# Patient Record
Sex: Female | Born: 1995 | Race: White | Hispanic: Yes | Marital: Single | Smoking: Never smoker
Health system: Southern US, Community
[De-identification: ages and names within clinical notes are randomized; demographics above are authoritative.]

## PROBLEM LIST (undated history)

## (undated) DIAGNOSIS — Z789 Other specified health status: Secondary | ICD-10-CM

## (undated) HISTORY — PX: NO PAST SURGERIES: SHX2092

---

## 2020-05-15 ENCOUNTER — Encounter (HOSPITAL_COMMUNITY): Payer: Self-pay | Admitting: Obstetrics and Gynecology

## 2020-05-15 ENCOUNTER — Inpatient Hospital Stay (HOSPITAL_COMMUNITY): Payer: Medicaid Other

## 2020-05-15 ENCOUNTER — Inpatient Hospital Stay (HOSPITAL_COMMUNITY)
Admission: AD | Admit: 2020-05-15 | Discharge: 2020-05-15 | Disposition: A | Discharge status: Home / Self Care | Payer: Medicaid Other | Attending: Obstetrics and Gynecology | Admitting: Obstetrics and Gynecology

## 2020-05-15 ENCOUNTER — Other Ambulatory Visit: Payer: Self-pay

## 2020-05-15 DIAGNOSIS — Z3A01 Less than 8 weeks gestation of pregnancy: Secondary | ICD-10-CM | POA: Diagnosis not present

## 2020-05-15 DIAGNOSIS — O26891 Other specified pregnancy related conditions, first trimester: Secondary | ICD-10-CM | POA: Diagnosis not present

## 2020-05-15 DIAGNOSIS — R1030 Lower abdominal pain, unspecified: Secondary | ICD-10-CM | POA: Diagnosis not present

## 2020-05-15 DIAGNOSIS — O3680X0 Pregnancy with inconclusive fetal viability, not applicable or unspecified: Secondary | ICD-10-CM

## 2020-05-15 DIAGNOSIS — Z6790 Unspecified blood type, Rh positive: Secondary | ICD-10-CM | POA: Diagnosis not present

## 2020-05-15 DIAGNOSIS — N898 Other specified noninflammatory disorders of vagina: Secondary | ICD-10-CM | POA: Diagnosis present

## 2020-05-15 DIAGNOSIS — O200 Threatened abortion: Secondary | ICD-10-CM

## 2020-05-15 DIAGNOSIS — O209 Hemorrhage in early pregnancy, unspecified: Secondary | ICD-10-CM | POA: Insufficient documentation

## 2020-05-15 DIAGNOSIS — O4690 Antepartum hemorrhage, unspecified, unspecified trimester: Secondary | ICD-10-CM

## 2020-05-15 HISTORY — DX: Other specified health status: Z78.9

## 2020-05-15 LAB — COMPREHENSIVE METABOLIC PANEL
ALT: 19 U/L (ref 0–44)
AST: 20 U/L (ref 15–41)
Albumin: 4.1 g/dL (ref 3.5–5.0)
Alkaline Phosphatase: 74 U/L (ref 38–126)
Anion gap: 11 (ref 5–15)
BUN: 6 mg/dL (ref 6–20)
CO2: 23 mmol/L (ref 22–32)
Calcium: 9.1 mg/dL (ref 8.9–10.3)
Chloride: 103 mmol/L (ref 98–111)
Creatinine, Ser: 0.45 mg/dL (ref 0.44–1.00)
GFR calc Af Amer: >60 mL/min (ref >60)
GFR calc non Af Amer: >60 mL/min (ref >60)
Glucose, Bld: 102 mg/dL — ABNORMAL HIGH (ref 70–99)
Potassium: 3.8 mmol/L (ref 3.5–5.1)
Sodium: 137 mmol/L (ref 135–145)
Total Bilirubin: 0.5 mg/dL (ref 0.3–1.2)
Total Protein: 7.4 g/dL (ref 6.5–8.1)

## 2020-05-15 LAB — CBC WITH DIFFERENTIAL/PLATELET
Abs Immature Granulocytes: 0.03 10*3/uL (ref 0.00–0.07)
Basophils Absolute: 0 10*3/uL (ref 0.0–0.1)
Basophils Relative: 0 %
Eosinophils Absolute: 0.3 10*3/uL (ref 0.0–0.5)
Eosinophils Relative: 3 %
HCT: 44.3 % (ref 36.0–46.0)
Hemoglobin: 15 g/dL (ref 12.0–15.0)
Immature Granulocytes: 0 %
Lymphocytes Relative: 22 %
Lymphs Abs: 1.8 10*3/uL (ref 0.7–4.0)
MCH: 30.1 pg (ref 26.0–34.0)
MCHC: 33.9 g/dL (ref 30.0–36.0)
MCV: 89 fL (ref 80.0–100.0)
Monocytes Absolute: 0.5 10*3/uL (ref 0.1–1.0)
Monocytes Relative: 6 %
Neutro Abs: 5.4 10*3/uL (ref 1.7–7.7)
Neutrophils Relative %: 69 %
Platelets: 259 10*3/uL (ref 150–400)
RBC: 4.98 MIL/uL (ref 3.87–5.11)
RDW: 12.4 % (ref 11.5–15.5)
WBC: 8 10*3/uL (ref 4.0–10.5)
nRBC: 0 % (ref 0.0–0.2)

## 2020-05-15 LAB — WET PREP, GENITAL
Clue Cells Wet Prep HPF POC: NONE SEEN
Sperm: NONE SEEN
Trich, Wet Prep: NONE SEEN
Yeast Wet Prep HPF POC: NONE SEEN

## 2020-05-15 LAB — I-STAT BETA HCG BLOOD, ED (MC, WL, AP ONLY): I-stat hCG, quantitative: 19.3 m[IU]/mL — ABNORMAL HIGH (ref <5)

## 2020-05-15 LAB — HCG, QUANTITATIVE, PREGNANCY: hCG, Beta Chain, Quant, S: 22 m[IU]/mL — ABNORMAL HIGH (ref <5)

## 2020-05-15 NOTE — Discharge Instructions (Signed)
Threatened Miscarriage  A threatened miscarriage occurs when a woman has vaginal bleeding during the first 20 weeks of pregnancy but the pregnancy has not ended. If you have vaginal bleeding during this time, your health care provider will do tests to make sure you are still pregnant. If the tests show that you are still pregnant and that the developing baby (fetus) inside your uterus is still growing, your condition is considered a threatened miscarriage. A threatened miscarriage does not mean your pregnancy will end, but it does increase the risk of losing your pregnancy (complete miscarriage). What are the causes? The cause of this condition is usually not known. For women who go on to have a complete miscarriage, the most common cause is an abnormal number of chromosomes in the developing baby. Chromosomes are the structures inside cells that hold all of a person's genetic material. What increases the risk? The following lifestyle factors may increase your risk of a miscarriage in early pregnancy:  Smoking.  Drinking excessive amounts of alcohol or caffeine.  Recreational drug use. The following preexisting health conditions may increase your risk of a miscarriage in early pregnancy:  Polycystic ovary syndrome.  Uterine fibroids.  Infections.  Diabetes mellitus. What are the signs or symptoms? Symptoms of this condition include:  Vaginal bleeding.  Mild abdominal pain or cramps. How is this diagnosed? If you have bleeding with or without abdominal pain before 20 weeks of pregnancy, your health care provider will do tests to check whether you are still pregnant. These will include:  Ultrasound. This test uses sound waves to create images of the inside of your uterus. This allows your health care provider to look at your developing baby and other structures, such as your placenta.  Pelvic exam. This is an internal exam of your vagina and cervix.  Measurement of your baby's heart  rate.  Laboratory tests such as blood tests, urine tests, or swabs for infection You may be diagnosed with a threatened miscarriage if:  Ultrasound testing shows that you are still pregnant.  Your baby's heart rate is strong.  A pelvic exam shows that the opening between your uterus and your vagina (cervix) is closed.  Blood tests confirm that you are still pregnant. How is this treated? No treatments have been shown to prevent a threatened miscarriage from going on to a complete miscarriage. However, the right home care is important. Follow these instructions at home:  Get plenty of rest.  Do not have sex or use tampons if you have vaginal bleeding.  Do not douche.  Do not smoke or use recreational drugs.  Do not drink alcohol.  Avoid caffeine.  Keep all follow-up prenatal visits as told by your health care provider. This is important. Contact a health care provider if:  You have light vaginal bleeding or spotting while pregnant.  You have abdominal pain or cramping.  You have a fever. Get help right away if:  You have heavy vaginal bleeding.  You have blood clots coming from your vagina.  You pass tissue from your vagina.  You leak fluid, or you have a gush of fluid from your vagina.  You have severe low back pain or abdominal cramps.  You have fever, chills, and severe abdominal pain. Summary  A threatened miscarriage occurs when a woman has vaginal bleeding during the first 20 weeks of pregnancy but the pregnancy has not ended.  The cause of a threatened miscarriage is usually not known.  Symptoms of this condition may   include vaginal bleeding and mild abdominal pain or cramps.  No treatments have been shown to prevent a threatened miscarriage from going on to a complete miscarriage.  Keep all follow-up prenatal visits as told by your health care provider. This is important. This information is not intended to replace advice given to you by your health  care provider. Make sure you discuss any questions you have with your health care provider. Document Revised: 12/19/2017 Document Reviewed: 02/08/2017 Elsevier Patient Education  2020 [employer]  

## 2020-05-15 NOTE — ED Notes (Signed)
This RN called MAU charge and gave report

## 2020-05-15 NOTE — ED Notes (Signed)
Transport called to bring pt over

## 2020-05-15 NOTE — ED Provider Notes (Signed)
  24 year old female G2P1001, LMP 04/12/2020, who presents to the emergency department with a chief complaint of vaginal bleeding.  The patient reports that she noticed some pink-tinged vaginal spotting earlier tonight.  Later, she noticed frank vaginal bleeding.  She also reports that she has been having some lower abdominal 5/10 pain and cramping that has been intermittent since onset 4 days ago.  No fever, chills, shortness of breath, dysuria, rash, diarrhea, or vomiting.  No treatment prior to arrival.  She believes that she is approximately 3 to [redacted] weeks pregnant as her last menstrual cycle was 04/12/2020.   She is mildly tachycardic.  Blood pressures are soft at 98/79, but no previous available for comparison of her baseline.  She is afebrile.  No respiratory distress and she has no increased work of breathing.  On exam, she has some tenderness to palpation in the bilateral lower abdomen, left greater than right.  No rebound or guarding.  Abdomen is soft and nondistended.  Pregnancy test is positive. Spoke with Venia Carbon, MAU APP, who will accept the patient for transfer to the MAU for further work-up and evaluation.  Patient is hemodynamically stable and in no acute distress at time of transfer.   Barkley Boards, PA-C 05/15/20 0131    Dione Booze, MD 05/15/20 [phone]

## 2020-05-15 NOTE — MAU Note (Addendum)
Pt reports to MAU c/o lower left sided abdominal pain. Pt states she was having pink spotting and then it was increasing today. Pt reports having period like cramps and back pain. Pt reports nausea and vomiting. Pt reports feeling stressed.

## 2020-05-15 NOTE — MAU Provider Note (Signed)
History     CSN: [id number]  Arrival date and time: 05/15/20 0025   First Provider Initiated Contact with Patient 05/15/20 0249      Chief Complaint  Patient presents with  . Vaginal Bleeding   HPI   [name] is a 24 y.o. female G2P1001 @ [redacted]w[redacted]d here with abdominal pain and bleeding. Symptoms started today.  Had an appointment with her provider last week at [facility] and had + pregnancy test. The pain is located all across her lower abdomen. She had not tried anything for the pain She rates her pain 7/10 at this time. The bleeding is bright red and menstrual like.    OB History    Gravida  2   Para  1   Term  1   Preterm      AB      Living  1     SAB      TAB      Ectopic      Multiple      Live Births  1           Past Medical History:  Diagnosis Date  . Medical history non-contributory     Past Surgical History:  Procedure Laterality Date  . NO PAST SURGERIES      History reviewed. No pertinent family history.  Social History   Tobacco Use  . Smoking status: Never Smoker  . Smokeless tobacco: Never Used  Substance Use Topics  . Alcohol use: Never  . Drug use: Never    Allergies: No Known Allergies  Medications Prior to Admission  Medication Sig Dispense Refill Last Dose  . prenatal vitamin w/FE, FA (PRENATAL 1 + 1) 27-1 MG TABS tablet Take 1 tablet by mouth daily at 12 noon.   05/14/2020 at Unknown time   Results for orders placed or performed during the hospital encounter of 05/15/20 (from the past 48 hour(s))  I-Stat Beta hCG blood, ED ([facility], [facility], [facility] only)     Status: Abnormal   Collection Time: 05/15/20 12:57 AM  Result Value Ref Range   I-stat hCG, quantitative 19.3 (H) <5 mIU/mL   Comment 3            Comment:   GEST. AGE      CONC.  (mIU/mL)   <=1 WEEK        5 - 50     2 WEEKS       50 - 500     3 WEEKS       100 - 10,000     4 WEEKS     1,000 - 30,000        FEMALE AND NON-PREGNANT FEMALE:     LESS THAN 5 mIU/mL    CBC with Differential/Platelet     Status: None   Collection Time: 05/15/20  2:01 AM  Result Value Ref Range   WBC 8.0 4.0 - 10.5 K/uL   RBC 4.98 3.87 - 5.11 MIL/uL   Hemoglobin 15.0 12.0 - 15.0 g/dL   HCT 81.8 36 - 46 %   MCV 89.0 80.0 - 100.0 fL   MCH 30.1 26.0 - 34.0 pg   MCHC 33.9 30.0 - 36.0 g/dL   RDW 56.3 14.9 - 70.2 %   Platelets 259 150 - 400 K/uL   nRBC 0.0 0.0 - 0.2 %   Neutrophils Relative % 69 %   Neutro Abs 5.4 1.7 - 7.7 K/uL   Lymphocytes Relative 22 %  Lymphs Abs 1.8 0.7 - 4.0 K/uL   Monocytes Relative 6 %   Monocytes Absolute 0.5 0 - 1 K/uL   Eosinophils Relative 3 %   Eosinophils Absolute 0.3 0 - 0 K/uL   Basophils Relative 0 %   Basophils Absolute 0.0 0 - 0 K/uL   Immature Granulocytes 0 %   Abs Immature Granulocytes 0.03 0.00 - 0.07 K/uL    Comment: Performed at [facility], [address] [place], [place] [address]  Comprehensive metabolic panel     Status: Abnormal   Collection Time: 05/15/20  2:01 AM  Result Value Ref Range   Sodium 137 135 - 145 mmol/L   Potassium 3.8 3.5 - 5.1 mmol/L   Chloride 103 98 - 111 mmol/L   CO2 23 22 - 32 mmol/L   Glucose, Bld 102 (H) 70 - 99 mg/dL    Comment: Glucose reference range applies only to samples taken after fasting for at least 8 hours.   BUN 6 6 - 20 mg/dL   Creatinine, Ser 1.91 0.44 - 1.00 mg/dL   Calcium 9.1 8.9 - 47.8 mg/dL   Total Protein 7.4 6.5 - 8.1 g/dL   Albumin 4.1 3.5 - 5.0 g/dL   AST 20 15 - 41 U/L   ALT 19 0 - 44 U/L   Alkaline Phosphatase 74 38 - 126 U/L   Total Bilirubin 0.5 0.3 - 1.2 mg/dL   GFR calc non Af Amer >60 >60 mL/min   GFR calc Af Amer >60 >60 mL/min   Anion gap 11 5 - 15    Comment: Performed at [facility], [address] [place], [place] [address]  ABO/Rh     Status: None (Preliminary result)   Collection Time: 05/15/20  2:01 AM  Result Value Ref Range   ABO/RH(D) PENDING    No rh immune globuloin      NOT A RH IMMUNE GLOBULIN CANDIDATE, PT RH  POSITIVE Performed at [facility], [address] [place], [place] [address]   hCG, quantitative, pregnancy     Status: Abnormal   Collection Time: 05/15/20  2:01 AM  Result Value Ref Range   hCG, Beta Chain, Quant, S 22 (H) <5 mIU/mL    Comment:          GEST. AGE      CONC.  (mIU/mL)   <=1 WEEK        5 - 50     2 WEEKS       50 - 500     3 WEEKS       100 - 10,000     4 WEEKS     1,000 - 30,000     5 WEEKS     3,500 - 115,000   6-8 WEEKS     12,000 - 270,000    12 WEEKS     15,000 - 220,000        FEMALE AND NON-PREGNANT FEMALE:     LESS THAN 5 mIU/mL Performed at [facility], [address], [place], [place] [address]   Wet prep, genital     Status: Abnormal   Collection Time: 05/15/20  2:05 AM   Specimen: PATH Cytology Cervicovaginal Ancillary Only  Result Value Ref Range   Yeast Wet Prep HPF POC NONE SEEN NONE SEEN   Trich, Wet Prep NONE SEEN NONE SEEN   Clue Cells Wet Prep HPF POC NONE SEEN NONE SEEN  WBC, Wet Prep HPF POC MANY (A) NONE SEEN   Sperm NONE SEEN     Comment: Performed at [facility], [place] [address] [place], [place] [address]   US OB LESS THAN 14 WEEKS WITH OB TRANSVAGINAL  Result Date: 05/15/2020 CLINICAL DATA:  Bleeding left lower quadrant pain EXAM: OBSTETRIC <14 WK [place] AND TRANSVAGINAL OB US TECHNIQUE: Both transabdominal and transvaginal ultrasound examinations were performed for complete evaluation of the gestation as well as the maternal uterus, adnexal regions, and pelvic cul-de-sac. Transvaginal technique was performed to assess early pregnancy. COMPARISON:  None. FINDINGS: Intrauterine gestational sac: None Yolk sac:  Not Visualized. Subchorionic hemorrhage:  None visualized. Maternal uterus/adnexae: Normal appearing ovaries. IMPRESSION: No intrauterine pregnancy seen on this examination. May be too early to visualize on ultrasound. Recommend follow-up quantitative B-HCG levels and follow-up US in 14 days to assess . This  recommendation follows SRU consensus guidelines: Diagnostic Criteria for Nonviable Pregnancy Early in the First Trimester. [place] Med 2013; 540:0867-61. Electronically Signed   By: Prudencio Pair M.D.   On: 05/15/2020 02:42   Review of Systems  Gastrointestinal: Positive for abdominal pain.  Genitourinary: Positive for vaginal bleeding.  Neurological: Negative for dizziness and headaches.   Physical Exam   Blood pressure 98/79, pulse (!) 107, temperature 98 F (36.7 C), temperature source Oral, resp. rate 18, last menstrual period 04/12/2020, SpO2 98 %.  Physical Exam  Constitutional: She is oriented to person, place, and time. She does not appear ill. No distress.  HENT:  Head: Normocephalic.  GI: Normal appearance.  Genitourinary:    Vulva normal.     Pelvic exam was performed with patient supine.     Genitourinary Comments: Cervix closed, thick, posterior Scant dark blood on exam glove GC and Wet prep collected by RN    Musculoskeletal:        General: Normal range of motion.     Cervical back: Normal range of motion.  Neurological: She is alert and oriented to person, place, and time.  Skin: Skin is warm.    MAU Course  Procedures  None  MDM  Wet prep & GC HIV, CBC, Hcg, ABO US OB transvaginal  Offered patient pain medication: declined   Assessment and Plan   A:  1. Blood type, Rh positive   2. Vaginal bleeding during pregnancy   3. Threatened miscarriage   4. Pregnancy of unknown anatomic location      P:  Discharge home with strict return precautions Patient prefers to f/u with her provider with [facility] She needs f/u Tuesday for stat quant Pelvic rest Ectopic precautions   Annely Sliva, Artist Pais, NP 05/16/2020 9:06 AM

## 2020-05-15 NOTE — ED Triage Notes (Addendum)
Pt presents to ED POV. Pt c/o vaginal bleeding began today. Spotting when using toilet paper noticed blood. LQ abd cramping. Pt 4w pregnant. LMP 04/12/2020

## 2020-05-16 LAB — GC/CHLAMYDIA PROBE AMP (~~LOC~~) NOT AT ARMC
Chlamydia: NEGATIVE
Comment: NEGATIVE
Comment: NORMAL
Neisseria Gonorrhea: NEGATIVE

## 2020-05-16 LAB — ABO/RH: ABO/RH(D): O POS

## 2020-12-28 IMAGING — US US OB < 14 WEEKS - US OB TV
1 series · 15 of 28 positions shown · non-contrast
Comparison: None.

CLINICAL DATA: Bleeding left lower quadrant pain

EXAM:
OBSTETRIC <14 WK US AND TRANSVAGINAL OB US
TECHNIQUE: Both transabdominal and transvaginal ultrasound examinations were
performed for complete evaluation of the gestation as well as the
maternal uterus, adnexal regions, and pelvic cul-de-sac.
Transvaginal technique was performed to assess early pregnancy.

[Series 1: us ob < 14 weeks - us ob tv · 40 acquisitions, 15 frames shown]
[im 1/40]
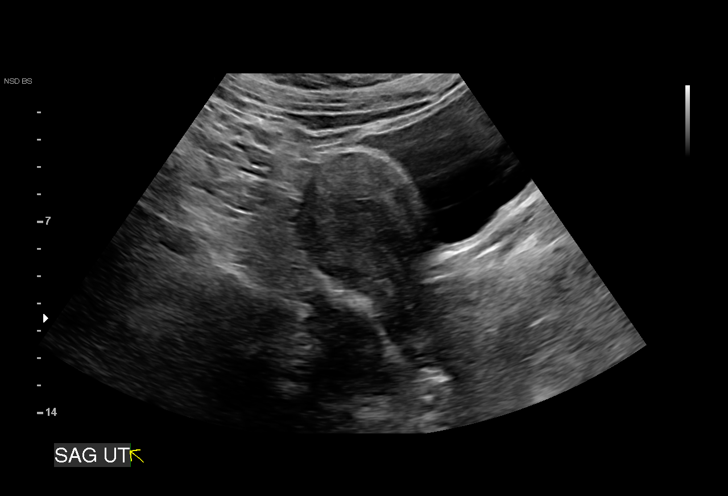
[im 3/40]
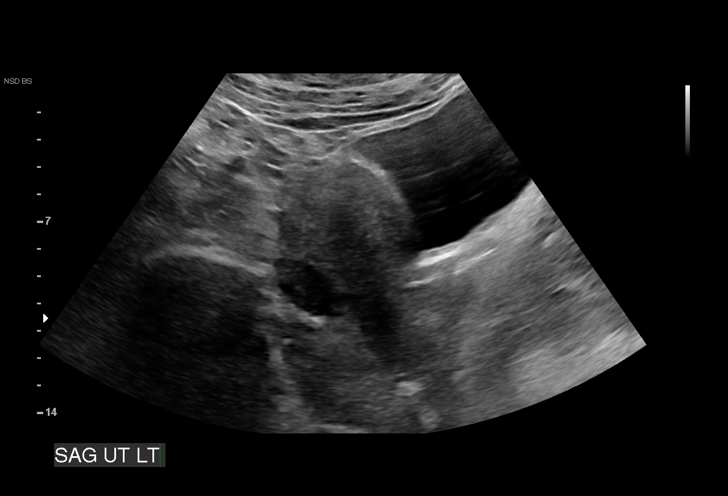
[im 6/40]
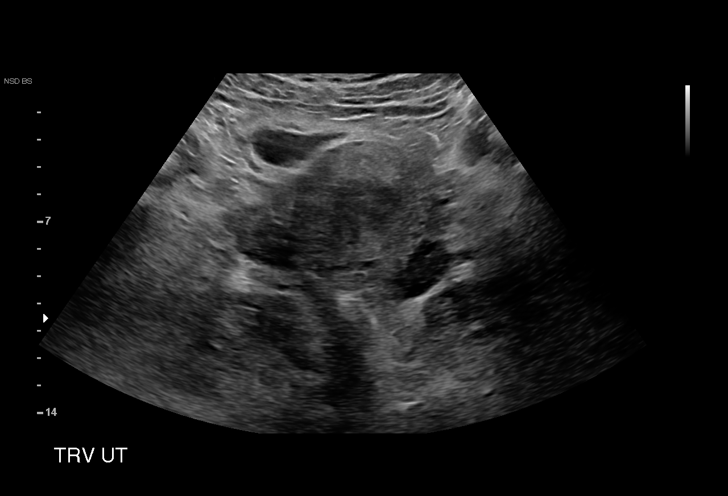
[im 9/40]
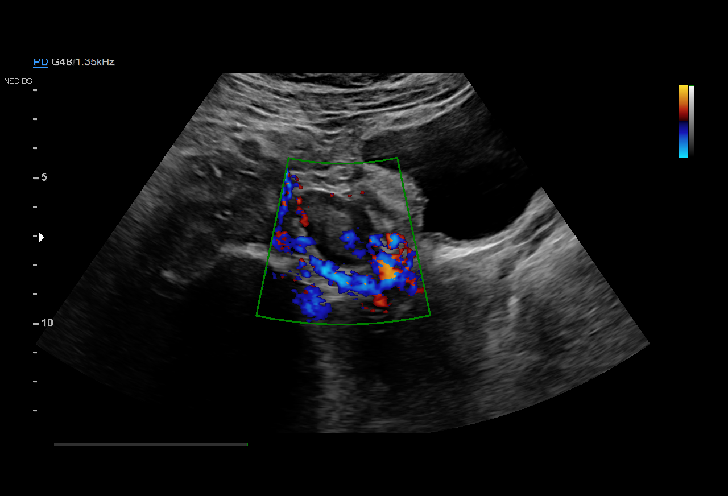
[im 12/40]
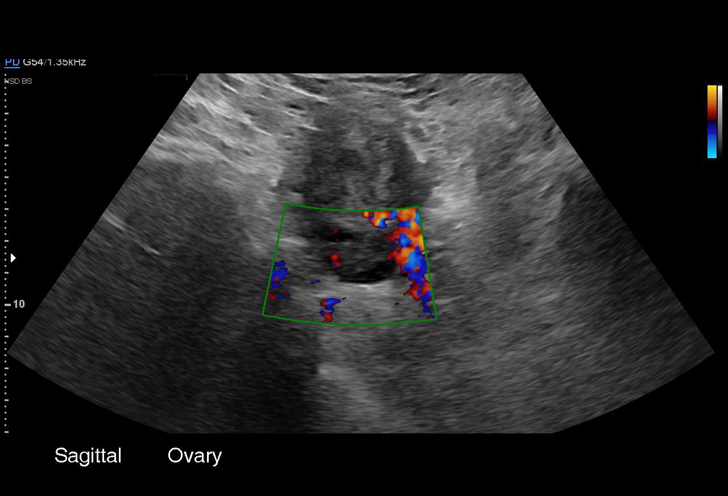
[im 15/40]
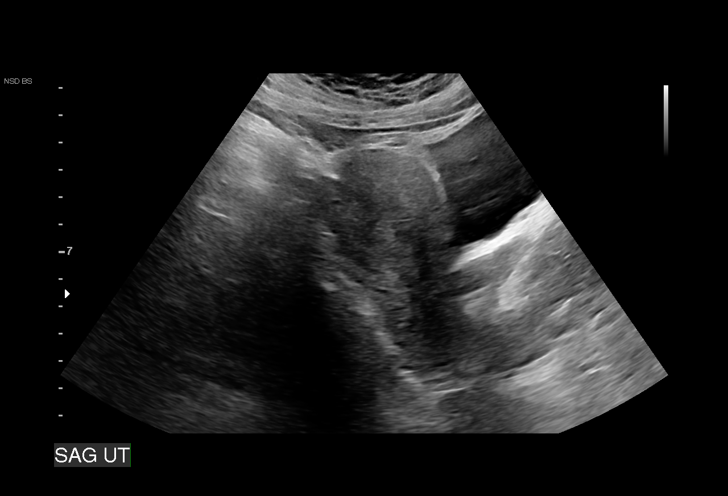
[im 18/40]
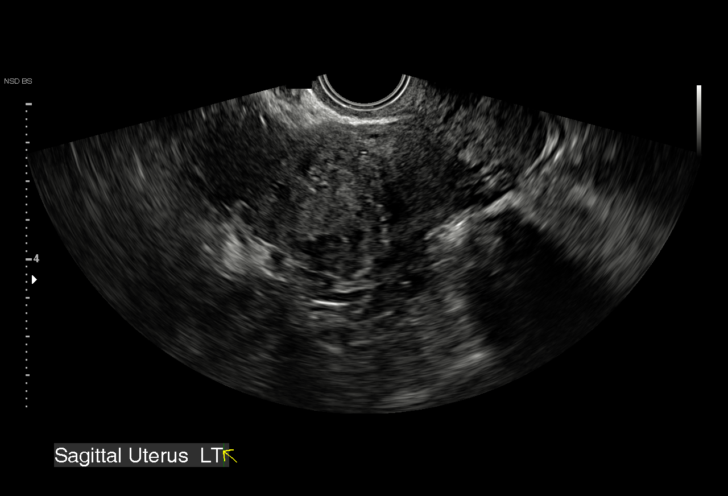
[im 21/40]
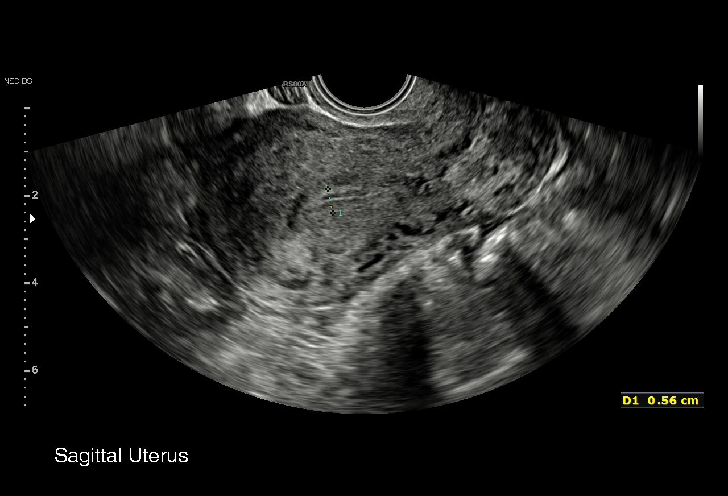
[im 22/40]
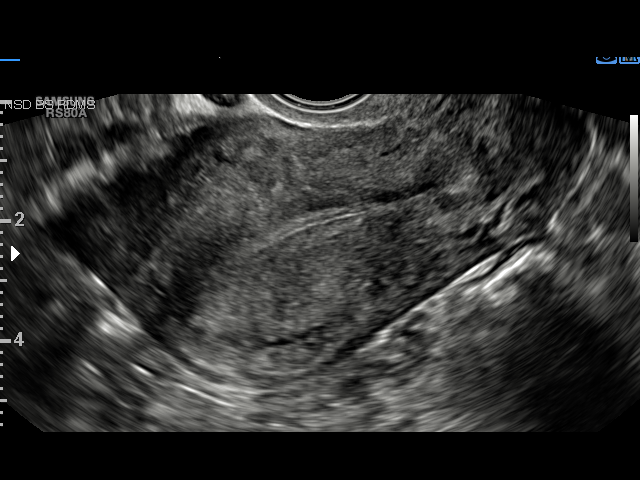
[im 25/40]
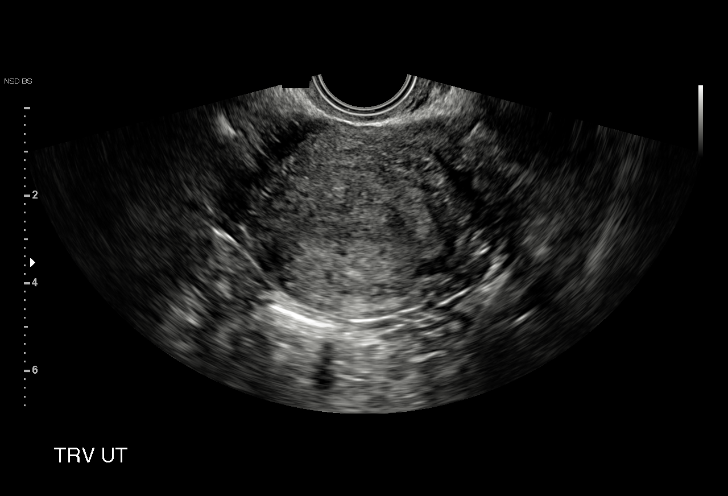
[im 28/40]
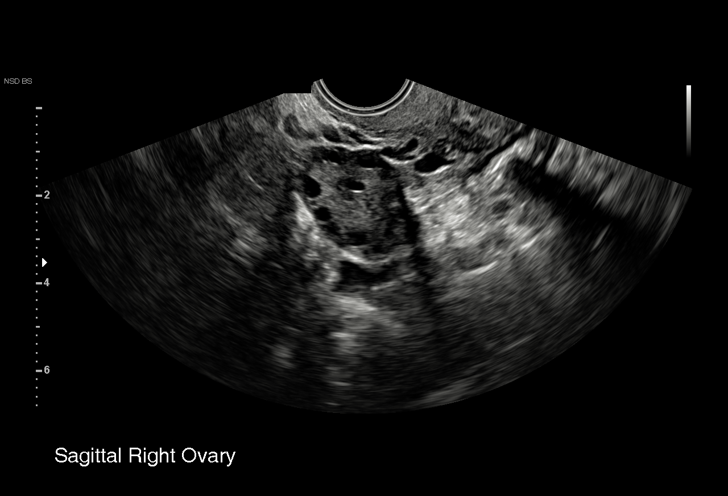
[im 31/40]
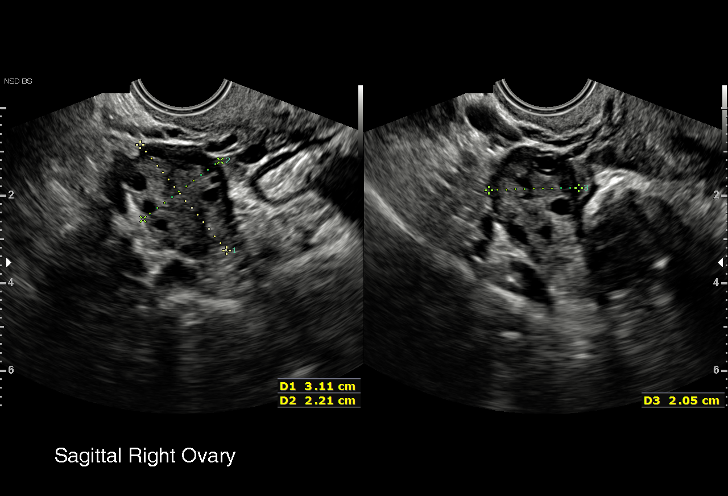
[im 34/40]
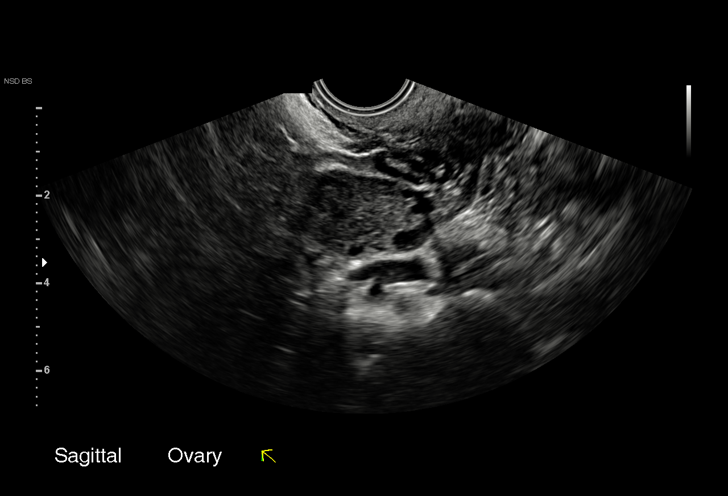
[im 37/40]
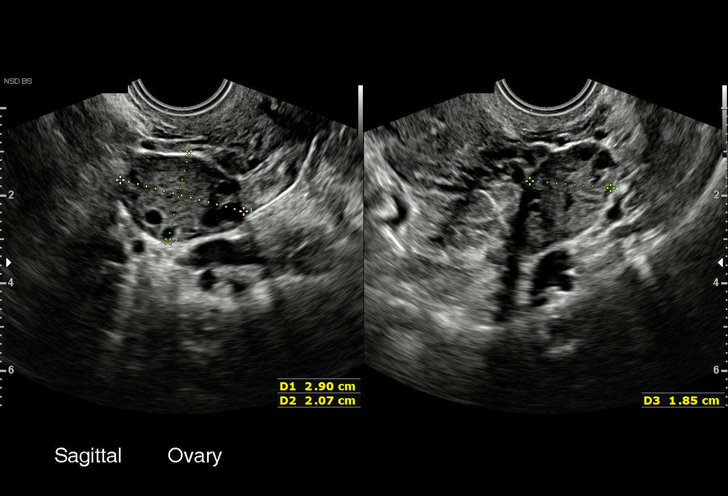
[im 40/40]
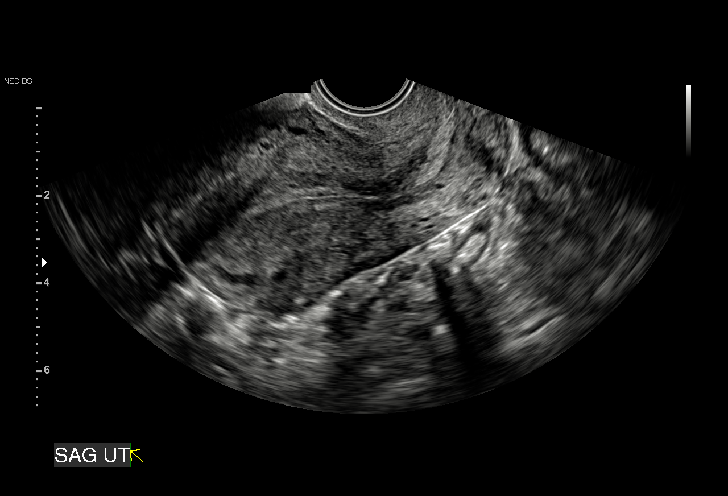

[15 of 28 positions shown; findings below may reference images not displayed]

FINDINGS: Intrauterine gestational sac: None

Yolk sac:  Not Visualized.

Subchorionic hemorrhage:  None visualized.

Maternal uterus/adnexae: Normal appearing ovaries.
IMPRESSION: No intrauterine pregnancy seen on this examination. May be too early
to visualize on ultrasound. Recommend follow-up quantitative B-HCG
levels and follow-up US in 14 days to assess . This recommendation
follows SRU consensus guidelines: Diagnostic Criteria for Nonviable
Pregnancy Early in the First Trimester. N Engl J Med 5870;
